# Patient Record
Sex: Male | Born: 1995 | Race: Black or African American | Hispanic: No | Marital: Single | Smoking: Never smoker
Health system: Southern US, Community
[De-identification: ages and names within clinical notes are randomized; demographics above are authoritative.]

---

## 2019-03-13 ENCOUNTER — Emergency Department (HOSPITAL_BASED_OUTPATIENT_CLINIC_OR_DEPARTMENT_OTHER)
Admission: EM | Admit: 2019-03-13 | Discharge: 2019-03-13 | Disposition: A | Discharge status: Home / Self Care | Payer: Self-pay | Attending: Emergency Medicine | Admitting: Emergency Medicine

## 2019-03-13 ENCOUNTER — Other Ambulatory Visit: Payer: Self-pay

## 2019-03-13 ENCOUNTER — Encounter (HOSPITAL_BASED_OUTPATIENT_CLINIC_OR_DEPARTMENT_OTHER): Payer: Self-pay

## 2019-03-13 DIAGNOSIS — K625 Hemorrhage of anus and rectum: Secondary | ICD-10-CM | POA: Insufficient documentation

## 2019-03-13 NOTE — ED Triage Notes (Signed)
Pt arrives ambulatory to ED with reports of bright red blood in stool this morning. Pt also reports having similar episode on Thursday. Pt reports history of having hemorrhoids. Denies lightheadedness or dizziness.

## 2019-03-16 NOTE — ED Provider Notes (Signed)
[facility] EMERGENCY DEPARTMENT Provider Note   CSN: [id number] Arrival date & time: 03/13/19  [id number]     History Chief Complaint  Patient presents with  . Blood In Stools    Kyle Landry is a 24 y.o. male.  HPI   24 year old male with blood in his stool.  Small blood of blood when he wipes after bowel movement.  This is been ongoing for several months.  Initially had quite a bit of pain with having a bowel movement and sometimes certain positions.  At one point he had to sit and lay on his side.  Pain is since improved though.  History reviewed. No pertinent past medical history.  There are no problems to display for this patient.   History reviewed. No pertinent surgical history.     No family history on file.  Social History   Tobacco Use  . Smoking status: Never Smoker  . Smokeless tobacco: Never Used  Substance Use Topics  . Alcohol use: Yes    Comment: "last year"  . Drug use: Yes    Types: Marijuana    Home Medications Prior to Admission medications   Not on File    Allergies    Amoxicillin  Review of Systems   Review of Systems All systems reviewed and negative, other than as noted in HPI.  Physical Exam Updated Vital Signs BP 130/70 (BP Location: Right Arm)   Pulse 82   Temp 98 F (36.7 C) (Oral)   Resp 18   Ht 5\' 5"  (1.651 m)   Wt 60.8 kg   SpO2 99%   BMI 22.30 kg/m   Physical Exam Vitals and nursing note reviewed.  Constitutional:      General: He is not in acute distress.    Appearance: He is well-developed.  HENT:     Head: Normocephalic and atraumatic.  Eyes:     General:        Right eye: No discharge.        Left eye: No discharge.     Conjunctiva/sclera: Conjunctivae normal.  Cardiovascular:     Rate and Rhythm: Normal rate and regular rhythm.     Heart sounds: Normal heart sounds. No murmur. No friction rub. No gallop.   Pulmonary:     Effort: Pulmonary effort is normal. No respiratory distress.   Breath sounds: Normal breath sounds.  Abdominal:     General: There is no distension.     Palpations: Abdomen is soft.     Tenderness: There is no abdominal tenderness.  Genitourinary:    Comments: Small anal fissure at the 6 o'clock position.  No hemorrhoids noted.  No gross blood.  No mass appreciated. Musculoskeletal:        General: No tenderness.     Cervical back: Neck supple.  Skin:    General: Skin is warm and dry.  Neurological:     Mental Status: He is alert.  Psychiatric:        Behavior: Behavior normal.        Thought Content: Thought content normal.     ED Results / Procedures / Treatments   Labs (all labs ordered are listed, but only abnormal results are displayed) Labs Reviewed - No data to display  EKG None  Radiology No results found.  Procedures Procedures (including critical care time)  Medications Ordered in ED Medications - No data to display  ED Course  I have reviewed the triage vital signs and the nursing notes.  Pertinent labs & imaging results that were available during my care of the patient were reviewed by me and considered in my medical decision making (see chart for details).    MDM Rules/Calculators/A&P                      24 year old male with small volume of rectal bleeding.  Possibly from an anal fissure.  He actually says he had quite a bit of pain initially which has since improved.  Continue treatment discussed.  If symptoms persist then would recommend GI follow-up. Final Clinical Impression(s) / ED Diagnoses Final diagnoses:  Rectal bleeding    Rx / DC Orders ED Discharge Orders    None       Virgel Manifold, MD 03/16/19 1038

## 2019-04-11 ENCOUNTER — Ambulatory Visit: Payer: Self-pay | Admitting: Nurse Practitioner

## 2019-04-17 ENCOUNTER — Ambulatory Visit: Payer: Self-pay | Admitting: Gastroenterology

## 2019-05-10 ENCOUNTER — Encounter: Payer: Self-pay | Admitting: Gastroenterology

## 2019-05-10 ENCOUNTER — Ambulatory Visit (INDEPENDENT_AMBULATORY_CARE_PROVIDER_SITE_OTHER): Payer: PRIVATE HEALTH INSURANCE | Admitting: Gastroenterology

## 2019-05-10 ENCOUNTER — Other Ambulatory Visit: Payer: Self-pay

## 2019-05-10 VITALS — BP 110/70 | HR 71 | Temp 98.2°F | Ht 65.0 in | Wt 142.0 lb

## 2019-05-10 DIAGNOSIS — K625 Hemorrhage of anus and rectum: Secondary | ICD-10-CM

## 2019-05-10 DIAGNOSIS — Z01818 Encounter for other preprocedural examination: Secondary | ICD-10-CM | POA: Diagnosis not present

## 2019-05-10 MED ORDER — FLUTICASONE PROPIONATE 0.05 % EX CREA: TOPICAL_CREAM | Freq: Two times a day (BID) | CUTANEOUS | 2 refills | Status: AC

## 2019-05-10 NOTE — Progress Notes (Signed)
Chief Complaint: Rectal bleeding  Referring Provider:  ED      ASSESSMENT AND PLAN;   #1. Rectal bleeding. D/d hoids, AVMs, colitis, polyps, stercoral ulcers etc, r/o colonic neoplasms or IBD.  Plan: -Colon with miralax. Discussed risks & benefits. (Risks including rare perforation req laparotomy, bleeding after bx/polypectomy req blood transfusion, rarely missing neoplasms, risks of anesthesia/sedation). Benefits outweigh the risks. Patient agrees to proceed. All the questions were answered. Consent forms given for review. -Benefiber 1 TBS p.o. QD with 8 oz of water. -fluticasone cream 0.05% generic 30g 1 bid PR x 10 days, 2 refills -Call if still with problems in 2 weeks. -CBC and CMP at time of colon. -May need urology consultation if he has any further hematuria or blood with ejaculation    HPI:    Kyle Landry is a 24 y.o. male  Rectal bleed x 1.66yrs Intermittent, mostly bright red in color and at times mixed with the stool.  Sometimes it "drips down".  Occasional rectal discomfort.  Occasional constipation but not always.  Denies having any abdominal pain or bloating.  No fever chills night sweats.  No recent weight loss.  In fact he has gained weight.  Seen in ED and given hydrocortisone.  Did not help much.  Interestingly, he would have blood in semen- when ejaculates.  No urinary problems.  No hematuria but had it in the past.  Has not seen a urologist.  No nonsteroidals.  No history of any rectal sex or rectal instrumentation.  Has been advised colonoscopy by ED.   History reviewed. No pertinent past medical history.  History reviewed. No pertinent surgical history.  Family History  Problem Relation Age of Onset  . Cancer Paternal Grandmother   . Diabetes Paternal Grandmother     Social History   Tobacco Use  . Smoking status: Never Smoker  . Smokeless tobacco: Never Used  Substance Use Topics  . Alcohol use: Yes    Comment: "last year"  . Drug  use: Yes    Types: Marijuana    No current outpatient medications on file.   No current facility-administered medications for this visit.    Allergies  Allergen Reactions  . Amoxicillin Hives    Review of Systems:  Constitutional: Denies fever, chills, diaphoresis, appetite change and fatigue.  HEENT: Denies photophobia, eye pain, redness, hearing loss, ear pain, congestion, sore throat, rhinorrhea, sneezing, mouth sores, neck pain, neck stiffness and tinnitus.   Respiratory: Denies SOB, DOE, cough, chest tightness,  and wheezing.   Cardiovascular: Denies chest pain, palpitations and leg swelling.  Genitourinary: Denies dysuria, urgency, frequency, hematuria, flank pain and difficulty urinating.  Musculoskeletal: Denies myalgias, back pain, joint swelling, arthralgias and gait problem.  Skin: No rash.  Neurological: Denies dizziness, seizures, syncope, weakness, light-headedness, numbness and headaches.  Hematological: Denies adenopathy. Easy bruising, personal or family bleeding history  Psychiatric/Behavioral: No anxiety or depression     Physical Exam:    BP 110/70   Pulse 71   Temp 98.2 F (36.8 C)   Ht 5\' 5"  (1.651 m)   Wt 142 lb (64.4 kg)   BMI 23.63 kg/m  Wt Readings from Last 3 Encounters:  05/10/19 142 lb (64.4 kg)  03/13/19 134 lb (60.8 kg)   Constitutional:  Well-developed, in no acute distress. Psychiatric: Normal mood and affect. Behavior is normal. HEENT: Pupils normal.  Conjunctivae are normal. No scleral icterus. Neck supple.  Cardiovascular: Normal rate, regular rhythm. No edema Pulmonary/chest: Effort normal and  breath sounds normal. No wheezing, rales or rhonchi. Abdominal: Soft, nondistended. Nontender. Bowel sounds active throughout. There are no masses palpable. No hepatomegaly. Rectal: To be performed at the time of colonoscopy.  Was performed in the ED showed small anal fissure.  Treated with hydrocortisone. Neurological: Alert and oriented to  person place and time. Skin: Skin is warm and dry. No rashes noted.  Data Reviewed: I have personally reviewed following labs and imaging studies    Carmell Austria, MD 05/10/2019, 9:23 AM  Cc: No ref. provider found

## 2019-05-10 NOTE — Patient Instructions (Addendum)
If you are age 22 or older, your body mass index should be between 23-30. Your Body mass index is 23.63 kg/m. If this is out of the aforementioned range listed, please consider follow up with your Primary Care Provider.  If you are age 49 or younger, your body mass index should be between 19-25. Your Body mass index is 23.63 kg/m. If this is out of the aformentioned range listed, please consider follow up with your Primary Care Provider.   You have been scheduled for a colonoscopy. Please follow written instructions given to you at your visit today.  Please pick up your prep supplies at the pharmacy within the next 1-3 days. If you use inhalers (even only as needed), please bring them with you on the day of your procedure. Your physician has requested that you go to www.startemmi.com and enter the access code given to you at your visit today. This web site gives a general overview about your procedure. However, you should still follow specific instructions given to you by our office regarding your preparation for the procedure.  We have sent the following medications to your pharmacy for you to pick up at your convenience: Fluticasone  Please purchase the following medications over the counter and take as directed: Benefiber 1 tablespoon once daily with 8 oz of water.   Please go to the lab at [facility] ([place] You will need to go to level "B", you do not need an appointment for this. Hours available are 7:30 am - 4:30 pm.    Thank you,  Dr. Jackquline Denmark

## 2019-05-26 ENCOUNTER — Ambulatory Visit (INDEPENDENT_AMBULATORY_CARE_PROVIDER_SITE_OTHER): Payer: PRIVATE HEALTH INSURANCE

## 2019-05-26 ENCOUNTER — Encounter: Payer: PRIVATE HEALTH INSURANCE | Admitting: Gastroenterology

## 2019-05-26 ENCOUNTER — Other Ambulatory Visit: Payer: Self-pay | Admitting: Gastroenterology

## 2019-05-26 DIAGNOSIS — Z1159 Encounter for screening for other viral diseases: Secondary | ICD-10-CM

## 2019-05-26 LAB — SARS CORONAVIRUS 2 (TAT 6-24 HRS): SARS Coronavirus 2: NEGATIVE

## 2019-05-29 ENCOUNTER — Encounter: Payer: Self-pay | Admitting: Gastroenterology

## 2019-05-29 ENCOUNTER — Ambulatory Visit (AMBULATORY_SURGERY_CENTER): Payer: PRIVATE HEALTH INSURANCE | Admitting: Gastroenterology

## 2019-05-29 ENCOUNTER — Other Ambulatory Visit: Payer: Self-pay

## 2019-05-29 VITALS — BP 95/54 | HR 65 | Temp 96.9°F | Resp 14 | Ht 65.0 in | Wt 142.0 lb

## 2019-05-29 DIAGNOSIS — K625 Hemorrhage of anus and rectum: Secondary | ICD-10-CM

## 2019-05-29 DIAGNOSIS — D122 Benign neoplasm of ascending colon: Secondary | ICD-10-CM

## 2019-05-29 DIAGNOSIS — K635 Polyp of colon: Secondary | ICD-10-CM

## 2019-05-29 MED ORDER — SODIUM CHLORIDE 0.9 % IV SOLN: 500.0000 mL | Freq: Once | INTRAVENOUS | Status: DC

## 2019-05-29 NOTE — Progress Notes (Signed)
Report to PACU, RN, vss, BBS= Clear.  

## 2019-05-29 NOTE — Patient Instructions (Signed)
Information on polyps and hemorrhoids given to you today.  Await pathology results.  Use Benefiber 1 teaspoon by mouth each day.  Preparation H cream - apply externally twice a day for 1o days as needed.  YOU HAD AN ENDOSCOPIC PROCEDURE TODAY AT [facility]:   Refer to the procedure report that was given to you for any specific questions about what was found during the examination.  If the procedure report does not answer your questions, please call your gastroenterologist to clarify.  If you requested that your care partner not be given the details of your procedure findings, then the procedure report has been included in a sealed envelope for you to review at your convenience later.  YOU SHOULD EXPECT: Some feelings of bloating in the abdomen. Passage of more gas than usual.  Walking can help get rid of the air that was put into your GI tract during the procedure and reduce the bloating. If you had a lower endoscopy (such as a colonoscopy or flexible sigmoidoscopy) you may notice spotting of blood in your stool or on the toilet paper. If you underwent a bowel prep for your procedure, you may not have a normal bowel movement for a few days.  Please Note:  You might notice some irritation and congestion in your nose or some drainage.  This is from the oxygen used during your procedure.  There is no need for concern and it should clear up in a day or so.  SYMPTOMS TO REPORT IMMEDIATELY:   Following lower endoscopy (colonoscopy or flexible sigmoidoscopy):  Excessive amounts of blood in the stool  Significant tenderness or worsening of abdominal pains  Swelling of the abdomen that is new, acute  Fever of 100F or higher    For urgent or emergent issues, a gastroenterologist can be reached at any hour by calling [phone]. Do not use MyChart messaging for urgent concerns.    DIET:  We do recommend a small meal at first, but then you may proceed to your regular diet.   Drink plenty of fluids but you should avoid alcoholic beverages for 24 hours.  ACTIVITY:  You should plan to take it easy for the rest of today and you should NOT DRIVE or use heavy machinery until tomorrow (because of the sedation medicines used during the test).    FOLLOW UP: Our staff will call the number listed on your records 48-72 hours following your procedure to check on you and address any questions or concerns that you may have regarding the information given to you following your procedure. If we do not reach you, we will leave a message.  We will attempt to reach you two times.  During this call, we will ask if you have developed any symptoms of COVID 19. If you develop any symptoms (ie: fever, flu-like symptoms, shortness of breath, cough etc.) before then, please call [phone].  If you test positive for Covid 19 in the 2 weeks post procedure, please call and report this information to [place].    If any biopsies were taken you will be contacted by phone or by letter within the next 1-3 weeks.  Please call us at [phone] if you have not heard about the biopsies in 3 weeks.    SIGNATURES/CONFIDENTIALITY: You and/or your care partner have signed paperwork which will be entered into your electronic medical record.  These signatures attest to the fact that that the information above on your After Visit Summary has  been reviewed and is understood.  Full responsibility of the confidentiality of this discharge information lies with you and/or your care-partner.

## 2019-05-29 NOTE — Progress Notes (Signed)
Temp by LS. VS by CW.

## 2019-05-29 NOTE — Progress Notes (Signed)
Called to room to assist during endoscopic procedure.  Patient ID and intended procedure confirmed with present staff. Received instructions for my participation in the procedure from the performing physician.  

## 2019-05-29 NOTE — Op Note (Signed)
[facility] Patient Name: Ameir Gaarder Procedure Date: 05/29/2019 2:31 PM MRN: [id number] Endoscopist: Jackquline Denmark , MD Age: 24 Referring MD:  Date of Birth: Oct 05, 1995 Gender: Male Account #: [id number] Procedure:                Colonoscopy Indications:              Rectal bleeding Medicines:                Monitored Anesthesia Care Procedure:                Pre-Anesthesia Assessment:                           - Prior to the procedure, a History and Physical                            was performed, and patient medications and                            allergies were reviewed. The patient's tolerance of                            previous anesthesia was also reviewed. The risks                            and benefits of the procedure and the sedation                            options and risks were discussed with the patient.                            All questions were answered, and informed consent                            was obtained. Prior Anticoagulants: The patient has                            taken no previous anticoagulant or antiplatelet                            agents. ASA Grade Assessment: I - A normal, healthy                            patient. After reviewing the risks and benefits,                            the patient was deemed in satisfactory condition to                            undergo the procedure.                           After obtaining informed consent, the colonoscope  was passed under direct vision. Throughout the                            procedure, the patient's blood pressure, pulse, and                            oxygen saturations were monitored continuously. The                            Colonoscope was introduced through the anus and                            advanced to the 2 cm into the ileum. The                            colonoscopy was performed without difficulty. The       patient tolerated the procedure well. The quality                            of the bowel preparation was adequate to identify                            polyps 6 mm and larger in size. The terminal ileum,                            ileocecal valve, appendiceal orifice, and rectum                            were photographed. Scope In: 2:50:02 PM Scope Out: 3:08:06 PM Scope Withdrawal Time: 0 hours 11 minutes 26 seconds  Total Procedure Duration: 0 hours 18 minutes 4 seconds  Findings:                 A 6 mm polyp was found in the mid ascending colon.                            The polyp was sessile. The polyp was removed with a                            cold snare. Resection and retrieval were complete.                           Non-bleeding internal hemorrhoids were found during                            retroflexion. The hemorrhoids were small.                           The terminal ileum appeared normal.                           The exam was otherwise without abnormality on  direct and retroflexion views. Complications:            No immediate complications. Estimated Blood Loss:     Estimated blood loss: none. Impression:               - One 6 mm polyp in the mid ascending colon,                            removed with a cold snare. Resected and retrieved.                           - Small internal hemorrhoids.                           - Otherwise grossly normal colonoscopy to TI.                            Limited due to quality of preparation. Recommendation:           - Patient has a contact number available for                            emergencies. The signs and symptoms of potential                            delayed complications were discussed with the                            patient. Return to normal activities tomorrow.                            Written discharge instructions were provided to the                            patient.                            - Resume previous diet.                           - Use Benefiber one teaspoon PO daily.                           - Increase water intake.                           - Await pathology results.                           - Repeat colonoscopy for surveillance based on                            pathology results.                           - The findings and recommendations were discussed  with the patient's GM De.                           - Preparation H cream: Apply externally BID for 10                            days prn.                           - Return to GI office PRN. Jackquline Denmark, MD 05/29/2019 3:20:21 PM This report has been signed electronically.

## 2019-05-31 ENCOUNTER — Telehealth: Payer: Self-pay

## 2019-05-31 NOTE — Telephone Encounter (Signed)
  Follow up Call-  Call back number 05/29/2019  Post procedure Call Back phone  # [phone]  Permission to leave phone message Yes     Patient questions:  Do you have a fever, pain , or abdominal swelling? No. Pain Score  0 *  Have you tolerated food without any problems? Yes.    Have you been able to return to your normal activities? Yes.    Do you have any questions about your discharge instructions: Diet   No. Medications  No. Follow up visit  No.  Do you have questions or concerns about your Care? No.  Actions: * If pain score is 4 or above: No action needed, pain <4.  Pt said his BM yesterday was still liquid.  He is back to eating and I advised him it may take a few days for his BM to become normal.  To give a few more days and call us back if his BM is not normal.  Maw  1. Have you developed a fever since your procedure? no  2.   Have you had an respiratory symptoms (SOB or cough) since your procedure? no  3.   Have you tested positive for COVID 19 since your procedure no  4.   Have you had any family members/close contacts diagnosed with the COVID 19 since your procedure?  no   If yes to any of these questions please route to Joylene John, RN and Erenest Rasher, RN

## 2019-06-04 ENCOUNTER — Encounter: Payer: Self-pay | Admitting: Gastroenterology

## 2019-06-21 ENCOUNTER — Other Ambulatory Visit: Payer: Self-pay

## 2019-06-21 ENCOUNTER — Emergency Department (HOSPITAL_BASED_OUTPATIENT_CLINIC_OR_DEPARTMENT_OTHER)
Admission: EM | Admit: 2019-06-21 | Discharge: 2019-06-21 | Disposition: A | Discharge status: Home / Self Care | Payer: PRIVATE HEALTH INSURANCE | Attending: Emergency Medicine | Admitting: Emergency Medicine

## 2019-06-21 ENCOUNTER — Encounter (HOSPITAL_BASED_OUTPATIENT_CLINIC_OR_DEPARTMENT_OTHER): Payer: Self-pay | Admitting: *Deleted

## 2019-06-21 ENCOUNTER — Emergency Department (HOSPITAL_BASED_OUTPATIENT_CLINIC_OR_DEPARTMENT_OTHER): Payer: PRIVATE HEALTH INSURANCE

## 2019-06-21 DIAGNOSIS — S71142A Puncture wound with foreign body, left thigh, initial encounter: Secondary | ICD-10-CM | POA: Diagnosis not present

## 2019-06-21 DIAGNOSIS — S3991XA Unspecified injury of abdomen, initial encounter: Secondary | ICD-10-CM | POA: Diagnosis not present

## 2019-06-21 DIAGNOSIS — T148XXA Other injury of unspecified body region, initial encounter: Secondary | ICD-10-CM

## 2019-06-21 DIAGNOSIS — S0990XA Unspecified injury of head, initial encounter: Secondary | ICD-10-CM | POA: Diagnosis present

## 2019-06-21 DIAGNOSIS — Y929 Unspecified place or not applicable: Secondary | ICD-10-CM | POA: Insufficient documentation

## 2019-06-21 DIAGNOSIS — S0083XA Contusion of other part of head, initial encounter: Secondary | ICD-10-CM | POA: Insufficient documentation

## 2019-06-21 DIAGNOSIS — Y999 Unspecified external cause status: Secondary | ICD-10-CM | POA: Diagnosis not present

## 2019-06-21 DIAGNOSIS — Y939 Activity, unspecified: Secondary | ICD-10-CM | POA: Insufficient documentation

## 2019-06-21 DIAGNOSIS — S299XXA Unspecified injury of thorax, initial encounter: Secondary | ICD-10-CM | POA: Insufficient documentation

## 2019-06-21 DIAGNOSIS — S00412A Abrasion of left ear, initial encounter: Secondary | ICD-10-CM | POA: Insufficient documentation

## 2019-06-21 DIAGNOSIS — S80852A Superficial foreign body, left lower leg, initial encounter: Secondary | ICD-10-CM

## 2019-06-21 LAB — CBC WITH DIFFERENTIAL/PLATELET
Abs Immature Granulocytes: 0.04 10*3/uL (ref 0.00–0.07)
Basophils Absolute: 0 10*3/uL (ref 0.0–0.1)
Basophils Relative: 0 %
Eosinophils Absolute: 0.1 10*3/uL (ref 0.0–0.5)
Eosinophils Relative: 1 %
HCT: 43.9 % (ref 39.0–52.0)
Hemoglobin: 14 g/dL (ref 13.0–17.0)
Immature Granulocytes: 0 %
Lymphocytes Relative: 20 %
Lymphs Abs: 1.8 10*3/uL (ref 0.7–4.0)
MCH: 28.9 pg (ref 26.0–34.0)
MCHC: 31.9 g/dL (ref 30.0–36.0)
MCV: 90.5 fL (ref 80.0–100.0)
Monocytes Absolute: 0.6 10*3/uL (ref 0.1–1.0)
Monocytes Relative: 7 %
Neutro Abs: 6.3 10*3/uL (ref 1.7–7.7)
Neutrophils Relative %: 72 %
Platelets: 242 10*3/uL (ref 150–400)
RBC: 4.85 MIL/uL (ref 4.22–5.81)
RDW: 12.4 % (ref 11.5–15.5)
WBC: 8.9 10*3/uL (ref 4.0–10.5)
nRBC: 0 % (ref 0.0–0.2)

## 2019-06-21 LAB — URINALYSIS, ROUTINE W REFLEX MICROSCOPIC
Bilirubin Urine: NEGATIVE
Glucose, UA: NEGATIVE mg/dL
Hgb urine dipstick: NEGATIVE
Ketones, ur: NEGATIVE mg/dL
Leukocytes,Ua: NEGATIVE
Nitrite: NEGATIVE
Protein, ur: NEGATIVE mg/dL
Specific Gravity, Urine: 1.01 (ref 1.005–1.030)
pH: 8.5 — ABNORMAL HIGH (ref 5.0–8.0)

## 2019-06-21 LAB — BASIC METABOLIC PANEL
Anion gap: 13 (ref 5–15)
BUN: 10 mg/dL (ref 6–20)
CO2: 25 mmol/L (ref 22–32)
Calcium: 9 mg/dL (ref 8.9–10.3)
Chloride: 103 mmol/L (ref 98–111)
Creatinine, Ser: 0.98 mg/dL (ref 0.61–1.24)
GFR calc Af Amer: >60 mL/min (ref >60)
GFR calc non Af Amer: >60 mL/min (ref >60)
Glucose, Bld: 97 mg/dL (ref 70–99)
Potassium: 4.1 mmol/L (ref 3.5–5.1)
Sodium: 141 mmol/L (ref 135–145)

## 2019-06-21 LAB — ETHANOL: Alcohol, Ethyl (B): 79 mg/dL — ABNORMAL HIGH (ref <10)

## 2019-06-21 MED ORDER — BACITRACIN ZINC 500 UNIT/GM EX OINT
TOPICAL_OINTMENT | Freq: Two times a day (BID) | CUTANEOUS | Status: DC
  Filled 2019-06-21: qty 28.35

## 2019-06-21 MED ORDER — METHOCARBAMOL 500 MG PO TABS
500.0000 mg | ORAL_TABLET | Freq: Three times a day (TID) | ORAL | 0 refills | Status: AC
Start: 2019-06-21 — End: 2019-06-28

## 2019-06-21 MED ORDER — FENTANYL CITRATE (PF) 100 MCG/2ML IJ SOLN
50.0000 ug | Freq: Once | INTRAMUSCULAR | Status: AC
  Administered 2019-06-21: 50 ug via INTRAVENOUS
  Filled 2019-06-21: qty 2

## 2019-06-21 MED ORDER — IOHEXOL 350 MG/ML SOLN
100.0000 mL | Freq: Once | INTRAVENOUS | Status: AC | PRN
  Administered 2019-06-21: 100 mL via INTRAVENOUS

## 2019-06-21 MED ORDER — ONDANSETRON HCL 4 MG/2ML IJ SOLN
4.0000 mg | Freq: Once | INTRAMUSCULAR | Status: AC
  Administered 2019-06-21: 4 mg via INTRAVENOUS
  Filled 2019-06-21: qty 2

## 2019-06-21 NOTE — Discharge Instructions (Addendum)
At this time there does not appear to be the presence of an emergent medical condition, however there is always the potential for conditions to change. Please read and follow the below instructions.  Please return to the Emergency Department immediately for any new or worsening symptoms. Please be sure to follow up with your Primary Care Provider within one week regarding your visit today; please call their office to schedule an appointment even if you are feeling better for a follow-up visit. You may use the muscle relaxer Robaxin as prescribed to help with your symptoms.  Do not drive or operate heavy machinery while taking Robaxin as it will make you drowsy.  Do not drink alcohol or take other sedating medications while taking Robaxin as this will worsen side effects. You have a small foreign body in your left leg.  You may call the general surgeon Dr. Lucia Gaskins on your discharge paperwork for further evaluation as needed.  It is possible that this foreign body will work its way out over time but if you want to do it sooner or if it starts to cause you any problems call the general surgeon for further evaluation. Your work-up today showed incidental findings including calcifications of each lateral hip joint.  Calcifications of your basal ganglia. 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  This will need further evaluation by a specialist to discern its cause.  Get help right away if: You have: Loss of feeling (numbness), tingling, or weakness in your arms or legs. Very bad neck pain, especially tenderness in the middle of the back of your neck. A change in your ability to control your pee or poop (stool). More pain in any area of your body. Swelling in any area of your body, especially your legs. Shortness of breath or light-headedness. Chest pain. Blood in your pee, poop, or vomit. Very bad pain in your belly  (abdomen) or your back. Very bad headaches or headaches that are getting worse. Sudden vision loss or double vision. Your eye suddenly turns red. The black center of your eye (pupil) is an odd shape or size. You have any new/concerning or worsening of symptoms   Please read the additional information packets attached to your discharge summary.  Do not take your medicine if  develop an itchy rash, swelling in your mouth or lips, or difficulty breathing; call 911 and seek immediate emergency medical attention if this occurs.  Note: Portions of this text may have been transcribed using voice recognition software. Every effort was made to ensure accuracy; however, inadvertent computerized transcription errors may still be present.

## 2019-06-21 NOTE — ED Notes (Signed)
Per grandmother, patient is confused and drowsy.  She stated that he seems not to remember anything.  Patient is alert x 4 and oriented and sleepy but arousable.
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
## 2019-06-21 NOTE — ED Notes (Signed)
ED Provider at bedside. 
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
## 2020-12-02 IMAGING — CR DG FEMUR 2+V*L*
4 series · 4 of 4 positions shown · non-contrast
Comparison: None.

CLINICAL DATA: Puncture wound

EXAM:
LEFT FEMUR 2 VIEWS

[t femur with hip  ap left]
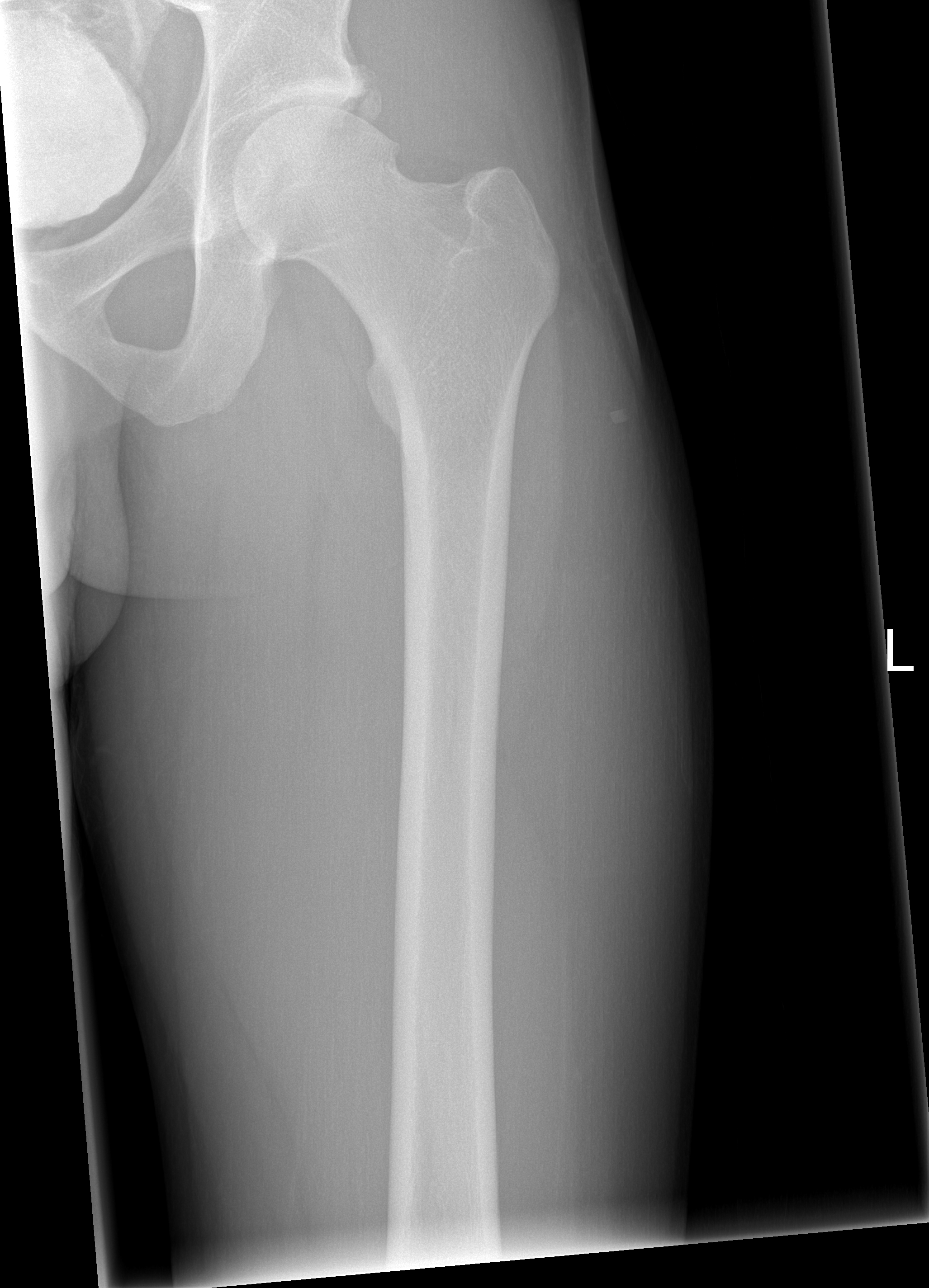

[t femur with knee ap left]
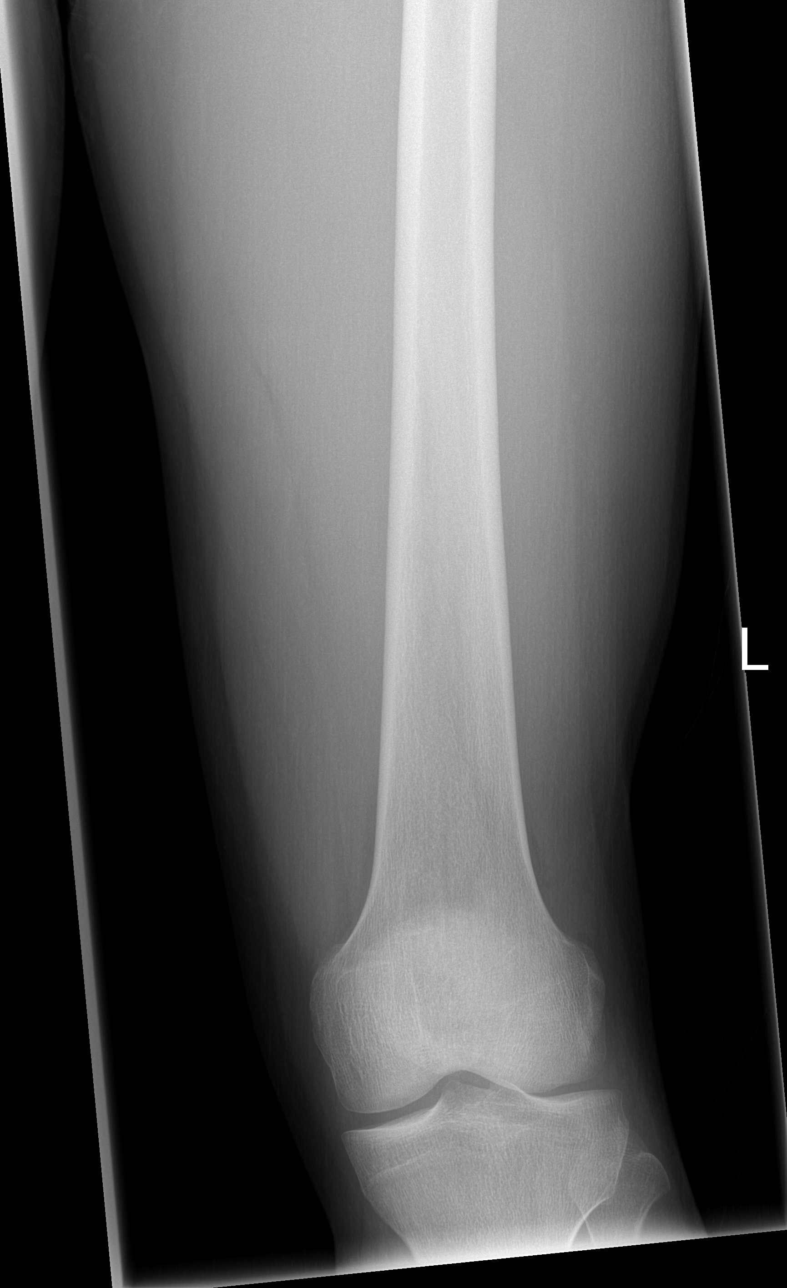

[t femur with hip lat left]
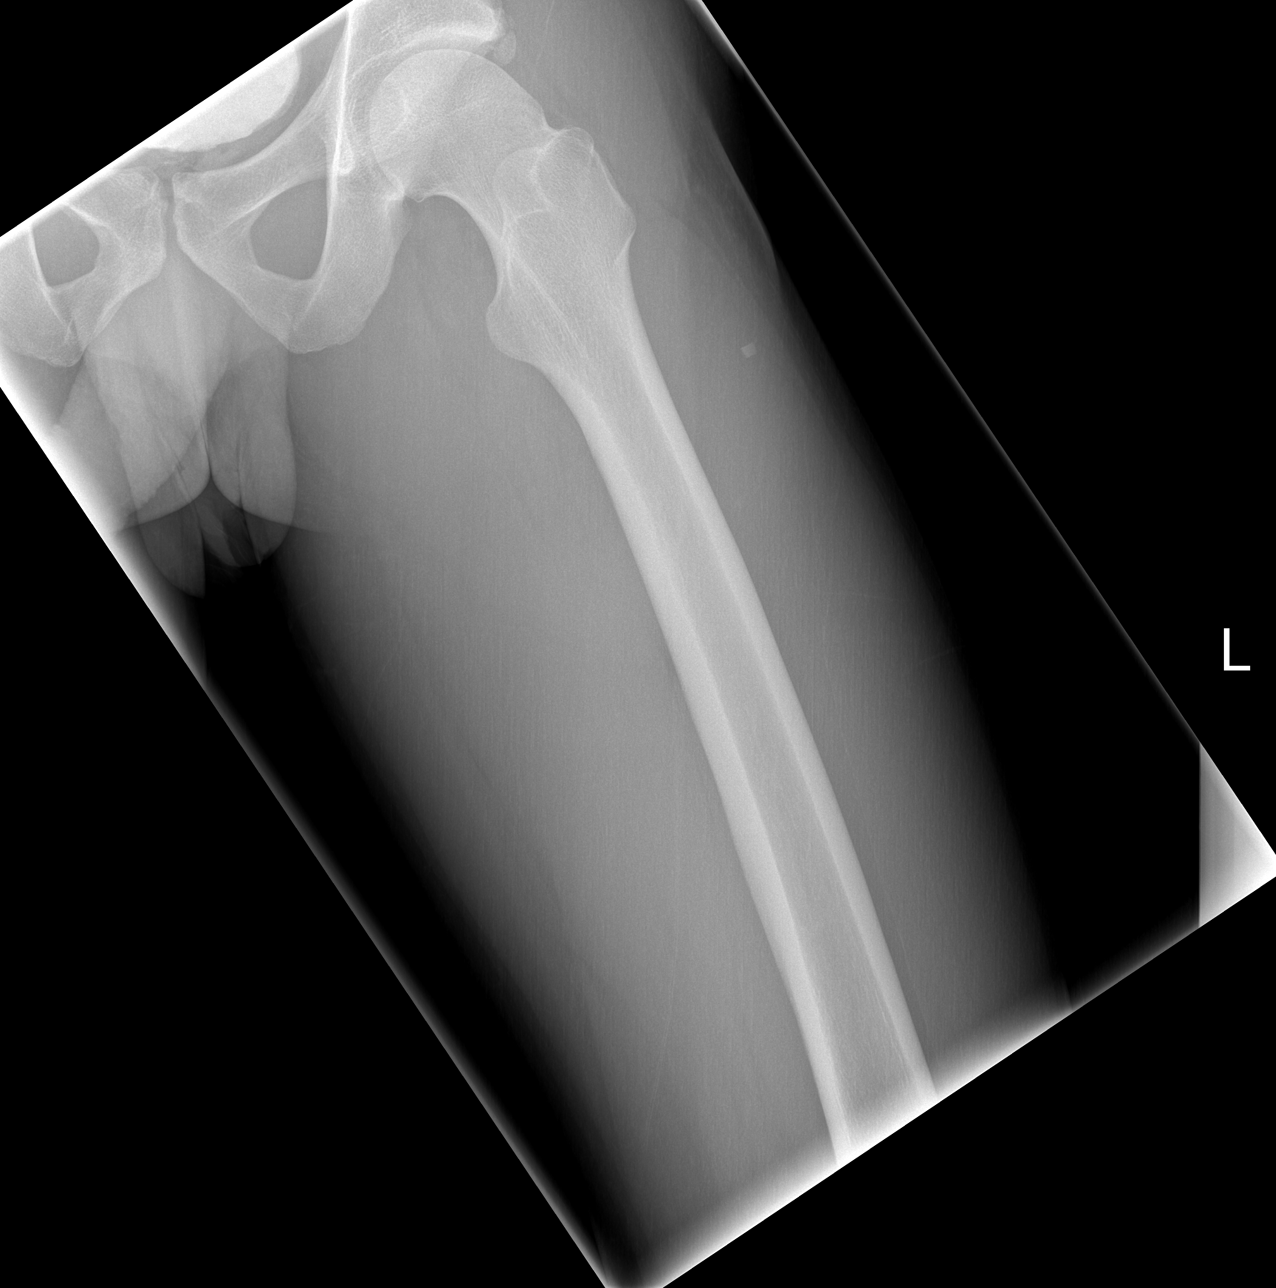

[t femur with knee lat left]
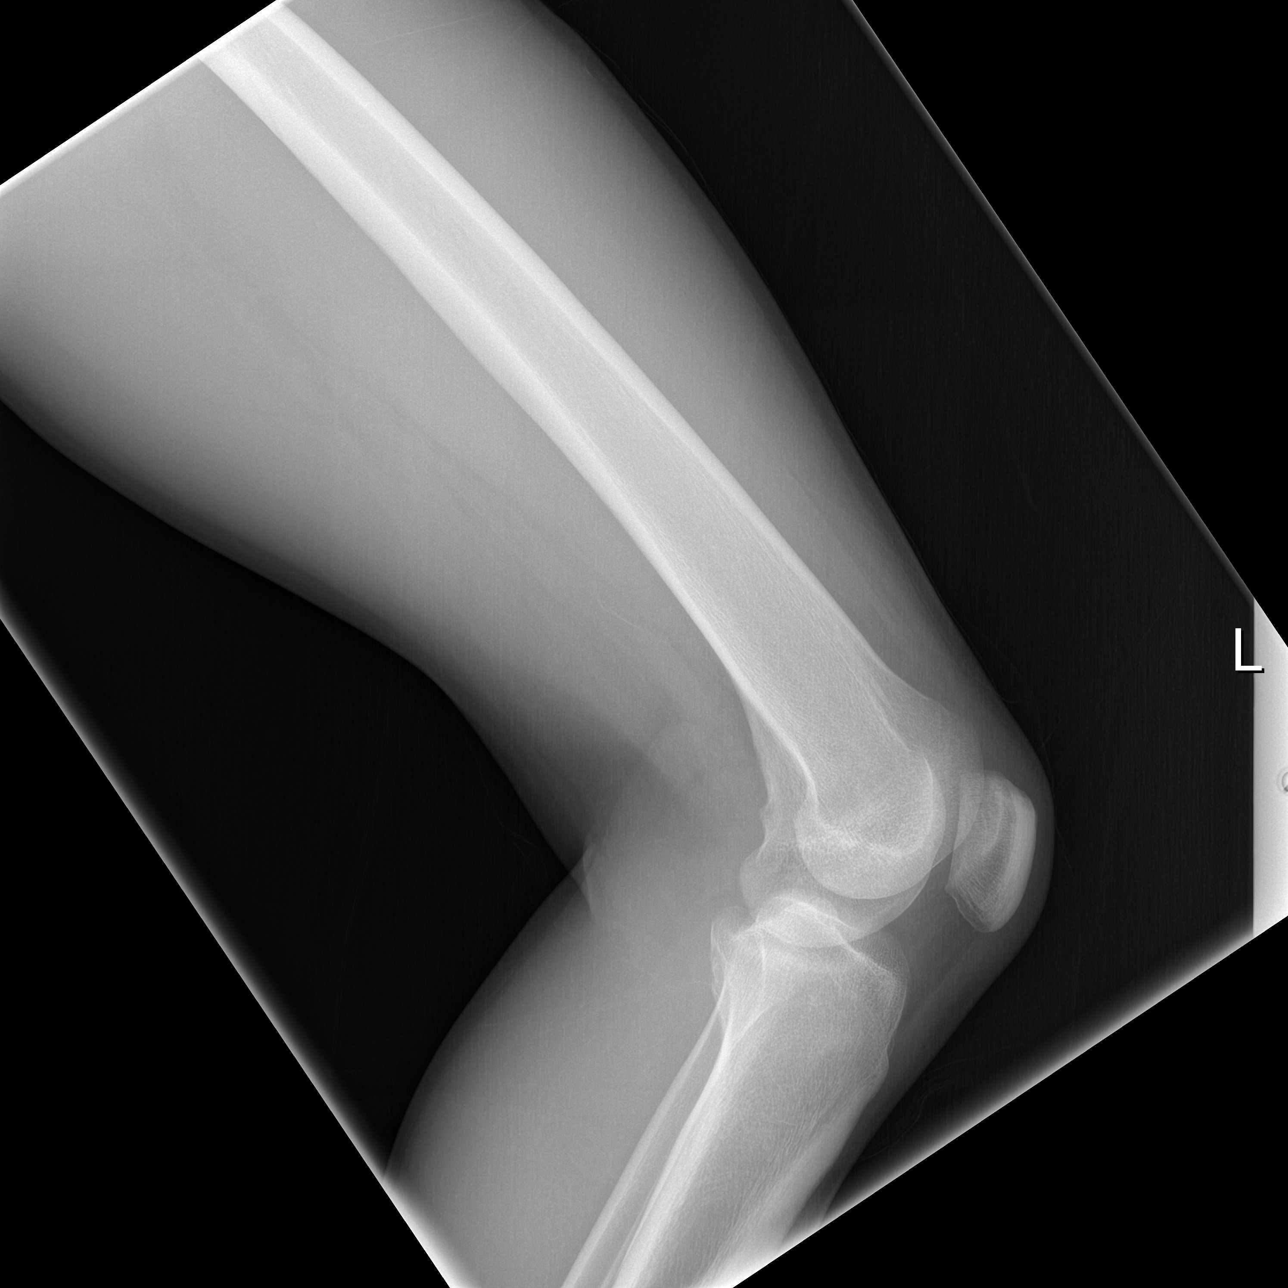

[4 of 4 positions shown; findings below may reference images not displayed]

FINDINGS: No fracture or malalignment. Suspected 3 mm rectangular foreign body
within the lateral soft tissues of the upper thigh at the level of
the lower trochanter.
IMPRESSION: 1. No acute osseous abnormality
2. Suspected 3 mm foreign body within the soft tissues of the upper
lateral thigh.
